# Patient Record
Sex: Female | Born: 1990 | Hispanic: No | Marital: Single | Smoking: Never smoker
Health system: Southern US, Community
[De-identification: ages and names within clinical notes are randomized; demographics above are authoritative.]

## PROBLEM LIST (undated history)

## (undated) DIAGNOSIS — H9190 Unspecified hearing loss, unspecified ear: Secondary | ICD-10-CM

## (undated) DIAGNOSIS — J45909 Unspecified asthma, uncomplicated: Secondary | ICD-10-CM

---

## 2017-02-14 ENCOUNTER — Emergency Department (HOSPITAL_COMMUNITY): Payer: Medicaid - Out of State

## 2017-02-14 ENCOUNTER — Encounter (HOSPITAL_COMMUNITY): Payer: Self-pay | Admitting: *Deleted

## 2017-02-14 ENCOUNTER — Emergency Department (HOSPITAL_COMMUNITY)
Admission: EM | Admit: 2017-02-14 | Discharge: 2017-02-14 | Disposition: A | Discharge status: Home / Self Care | Payer: Medicaid - Out of State | Attending: Emergency Medicine | Admitting: Emergency Medicine

## 2017-02-14 DIAGNOSIS — R062 Wheezing: Secondary | ICD-10-CM | POA: Diagnosis not present

## 2017-02-14 DIAGNOSIS — R0602 Shortness of breath: Secondary | ICD-10-CM | POA: Diagnosis not present

## 2017-02-14 DIAGNOSIS — R05 Cough: Secondary | ICD-10-CM

## 2017-02-14 DIAGNOSIS — R059 Cough, unspecified: Secondary | ICD-10-CM

## 2017-02-14 DIAGNOSIS — Z79899 Other long term (current) drug therapy: Secondary | ICD-10-CM | POA: Insufficient documentation

## 2017-02-14 DIAGNOSIS — Z880 Allergy status to penicillin: Secondary | ICD-10-CM | POA: Diagnosis not present

## 2017-02-14 LAB — CBC WITH DIFFERENTIAL/PLATELET
Basophils Absolute: 0.1 10*3/uL (ref 0.0–0.1)
Basophils Relative: 1 %
Eosinophils Absolute: 1.8 10*3/uL — ABNORMAL HIGH (ref 0.0–0.7)
Eosinophils Relative: 16 %
HEMATOCRIT: 43.3 % (ref 36.0–46.0)
HEMOGLOBIN: 15.4 g/dL — AB (ref 12.0–15.0)
LYMPHS ABS: 2.4 10*3/uL (ref 0.7–4.0)
LYMPHS PCT: 21 %
MCH: 29.6 pg (ref 26.0–34.0)
MCHC: 35.6 g/dL (ref 30.0–36.0)
MCV: 83.1 fL (ref 78.0–100.0)
Monocytes Absolute: 0.4 10*3/uL (ref 0.1–1.0)
Monocytes Relative: 4 %
NEUTROS PCT: 58 %
Neutro Abs: 6.7 10*3/uL (ref 1.7–7.7)
Platelets: 274 10*3/uL (ref 150–400)
RBC: 5.21 MIL/uL — AB (ref 3.87–5.11)
RDW: 12 % (ref 11.5–15.5)
WBC: 11.3 10*3/uL — ABNORMAL HIGH (ref 4.0–10.5)

## 2017-02-14 LAB — I-STAT TROPONIN, ED: Troponin i, poc: 0 ng/mL (ref 0.00–0.08)

## 2017-02-14 LAB — BASIC METABOLIC PANEL
Anion gap: 11 (ref 5–15)
BUN: 7 mg/dL (ref 6–20)
CHLORIDE: 104 mmol/L (ref 101–111)
CO2: 24 mmol/L (ref 22–32)
CREATININE: 0.68 mg/dL (ref 0.44–1.00)
Calcium: 9.5 mg/dL (ref 8.9–10.3)
GFR calc Af Amer: >60 mL/min (ref >60)
GFR calc non Af Amer: >60 mL/min (ref >60)
GLUCOSE: 94 mg/dL (ref 65–99)
POTASSIUM: 3.4 mmol/L — AB (ref 3.5–5.1)
Sodium: 139 mmol/L (ref 135–145)

## 2017-02-14 LAB — I-STAT BETA HCG BLOOD, ED (MC, WL, AP ONLY): I-stat hCG, quantitative: <5 m[IU]/mL (ref <5)

## 2017-02-14 MED ORDER — ALBUTEROL SULFATE (2.5 MG/3ML) 0.083% IN NEBU
5.0000 mg | INHALATION_SOLUTION | Freq: Once | RESPIRATORY_TRACT | Status: AC
  Administered 2017-02-14: 5 mg via RESPIRATORY_TRACT
  Filled 2017-02-14: qty 6

## 2017-02-14 MED ORDER — AEROCHAMBER PLUS FLO-VU LARGE MISC
Status: AC
  Filled 2017-02-14: qty 1

## 2017-02-14 MED ORDER — IOPAMIDOL (ISOVUE-370) INJECTION 76%
INTRAVENOUS | Status: AC
  Administered 2017-02-14: 100 mL via INTRAVENOUS
  Filled 2017-02-14: qty 100

## 2017-02-14 MED ORDER — AEROCHAMBER PLUS FLO-VU LARGE MISC
1.0000 | Freq: Once | Status: AC
  Administered 2017-02-14: 1
  Filled 2017-02-14: qty 1

## 2017-02-14 MED ORDER — ALBUTEROL SULFATE HFA 108 (90 BASE) MCG/ACT IN AERS: 1.0000 | INHALATION_SPRAY | Freq: Four times a day (QID) | RESPIRATORY_TRACT | 0 refills | Status: AC | PRN

## 2017-02-14 MED ORDER — PREDNISONE 20 MG PO TABS: 40.0000 mg | ORAL_TABLET | Freq: Every day | ORAL | 0 refills | Status: DC

## 2017-02-14 MED ORDER — IPRATROPIUM BROMIDE 0.02 % IN SOLN
0.5000 mg | Freq: Once | RESPIRATORY_TRACT | Status: AC
Start: 2017-02-14 — End: 2017-02-14
  Administered 2017-02-14: 0.5 mg via RESPIRATORY_TRACT
  Filled 2017-02-14: qty 2.5

## 2017-02-14 MED ORDER — ALBUTEROL SULFATE HFA 108 (90 BASE) MCG/ACT IN AERS
2.0000 | INHALATION_SPRAY | Freq: Once | RESPIRATORY_TRACT | Status: AC
  Administered 2017-02-14: 2 via RESPIRATORY_TRACT
  Filled 2017-02-14: qty 6.7

## 2017-02-14 MED ORDER — IPRATROPIUM BROMIDE 0.02 % IN SOLN
0.5000 mg | Freq: Once | RESPIRATORY_TRACT | Status: AC
  Administered 2017-02-14: 0.5 mg via RESPIRATORY_TRACT
  Filled 2017-02-14: qty 2.5

## 2017-02-14 NOTE — ED Notes (Signed)
ED Provider at bedside. 

## 2017-02-14 NOTE — ED Triage Notes (Signed)
The pt does not have asthma  She has had a cough for 2-3 weeks no temp    She feelslike something is stuck in her throat.  Not sure about anxiety.  lmp  marfch she just had a baby and she is breast feeding still

## 2017-02-14 NOTE — ED Notes (Signed)
Patient transported to CT 

## 2017-02-14 NOTE — ED Triage Notes (Addendum)
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

## 2017-02-14 NOTE — ED Notes (Signed)
Patient transported to X-ray 

## 2017-02-14 NOTE — ED Provider Notes (Signed)
[facility]-EMERGENCY DEPT Provider Note   CSN: [id number] Arrival date & time: 02/14/17  1611  By signing my name below, I, Linna Darner, attest that this documentation has been prepared under the direction and in the presence of Will Ralene Gasparyan, PA-C. Electronically Signed: Linna Darner, Scribe. 02/14/2017. 6:44 PM.  History   Chief Complaint Chief Complaint  Patient presents with  . Cough   The history is provided by the patient. A language interpreter was used (sign language).    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 Patient's cough has improved at times, but it has never completely resolved. A few days ago she experienced a sudden onset of dyspnea that has persisted and progressively worsened. She states that it feels like she cannot catch her breath. Patient has also had some occasional palpitations in association with the dyspnea; her last episode of palpitations was upon arrival to the ED and she denies any palpitations at interview. She received a breathing treatment in triage with some improvement of the dyspnea. Patient has no h/o asthma. She gave birth on 09/06/16 and recently stopped breast feeding the child. Patient does not use birth control or any daily medications. No recent surgeries or bodily trauma. Her grandmother had a blood clot in her heart and her mother had a DVT, but she has no personal history of blood clots. Patient denies hemoptysis, fevers, chills, nausea, vomiting, abdominal pain, chest pain, calf pain, leg swelling, or any other associated symptoms.  History reviewed. No pertinent past medical history.  There are no active problems to display for this patient.   History reviewed. No pertinent surgical history.  OB History    No data available       Home Medications    Prior to Admission medications   Medication Sig Start Date End Date  Taking? Authorizing Provider  albuterol (PROVENTIL HFA;VENTOLIN HFA) 108 (90 Base) MCG/ACT inhaler Inhale 1-2 puffs into the lungs every 6 (six) hours as needed for wheezing or shortness of breath. 02/14/17   Everlene Farrier, PA-C  predniSONE (DELTASONE) 20 MG tablet Take 2 tablets (40 mg total) by mouth daily. 02/14/17   Everlene Farrier, PA-C    Family History No family history on file.  Social History Social History  Substance Use Topics  . Smoking status: Never Smoker  . Smokeless tobacco: Never Used  . Alcohol use No     Allergies   Amoxicillin   Review of Systems Review of Systems  Constitutional: Negative for chills and fever.  HENT: Negative for congestion and sore throat.   Eyes: Negative for visual disturbance.  Respiratory: Positive for cough, shortness of breath and wheezing.   Cardiovascular: Positive for palpitations. Negative for chest pain and leg swelling.  Gastrointestinal: Negative for abdominal pain, diarrhea, nausea and vomiting.  Genitourinary: Negative for dysuria.  Musculoskeletal: Negative for back pain, myalgias and neck pain.  Skin: Negative for rash.  Neurological: Negative for syncope, light-headedness and headaches.   Physical Exam Updated Vital Signs BP 105/70   Pulse 92   Temp 98.6 F (37 C) (Oral)   Resp 13   Ht 5\' 5"  (1.651 m)   Wt 67.1 kg (148 lb)   SpO2 96%   BMI 24.63 kg/m   Physical Exam  Constitutional: She appears well-developed and well-nourished. No distress.  Nontoxic-appearing.  HENT:  Head: Normocephalic and atraumatic.  Right Ear: External ear normal.  Left Ear: External ear normal.  Mouth/Throat: Oropharynx is clear and moist.  Eyes: Pupils are equal, round, and reactive to light. Conjunctivae are normal. Right eye exhibits no discharge. Left eye exhibits no discharge.  Neck: Normal range of motion. Neck supple. No JVD present. No tracheal deviation present.  Cardiovascular: Normal rate, regular rhythm, normal heart  sounds and intact distal pulses.  Exam reveals no gallop and no friction rub.   No murmur heard. Bilateral radial, posterior tibialis and dorsalis pedis pulses are intact.    Pulmonary/Chest: Effort normal. No stridor. No respiratory distress. She has wheezes. She has no rales. She exhibits no tenderness.  Expiratory wheezes noted bilaterally. No rales or rhonchi. No increased work of breathing.  Abdominal: Soft. There is no tenderness. There is no guarding.  Musculoskeletal: Normal range of motion. She exhibits no edema or tenderness.  No lower extremity edema or tenderness.  Lymphadenopathy:    She has no cervical adenopathy.  Neurological: She is alert. Coordination normal.  Skin: Skin is warm and dry. Capillary refill takes less than 2 seconds. No rash noted. She is not diaphoretic. No erythema. No pallor.  Psychiatric: She has a normal mood and affect. Her behavior is normal.  Nursing note and vitals reviewed.  ED Treatments / Results  Labs (all labs ordered are listed, but only abnormal results are displayed) Labs Reviewed  BASIC METABOLIC PANEL - Abnormal; Notable for the following:       Result Value   Potassium 3.4 (*)    All other components within normal limits  CBC WITH DIFFERENTIAL/PLATELET - Abnormal; Notable for the following:    WBC 11.3 (*)    RBC 5.21 (*)    Hemoglobin 15.4 (*)    Eosinophils Absolute 1.8 (*)    All other components within normal limits  I-STAT TROPONIN, ED  I-STAT BETA HCG BLOOD, ED ([facility], [facility], [facility] ONLY)    EKG  EKG Interpretation ED ECG REPORT   Date: 02/14/2017  Rate: 78  Rhythm: normal sinus rhythm  QRS Axis: right  Intervals: QT prolonged (borderline)   ST/T Wave abnormalities: nonspecific T wave changes  Conduction Disutrbances:none  Narrative Interpretation:   Old EKG Reviewed: none available  I have personally reviewed the EKG tracing and agree with the computerized printout as noted.        Radiology Dg Chest 2  View  Result Date: 02/14/2017 CLINICAL DATA:  Shortness of breath.  Recent pregnancy. EXAM: CHEST  2 VIEW COMPARISON:  None. FINDINGS: The heart size and mediastinal contours are within normal limits. Both lungs are clear. The visualized skeletal structures are unremarkable. IMPRESSION: No active cardiopulmonary disease. Electronically Signed   By: Gerome Sam III M.D   On: 02/14/2017 17:56   Ct Angio Chest Pe W/cm &/or Wo Cm  Result Date: 02/14/2017 CLINICAL DATA:  Chronic cough and shortness of breath. Palpitations. Initial encounter. EXAM: CT ANGIOGRAPHY CHEST WITH CONTRAST TECHNIQUE: Multidetector CT imaging of the chest was performed using the standard protocol during bolus administration of intravenous contrast. Multiplanar CT image reconstructions and MIPs were obtained to evaluate the vascular anatomy. CONTRAST:  100 mL of Isovue 300 IV contrast COMPARISON:  Chest radiograph performed earlier today at 5:44 p.m. FINDINGS: Cardiovascular:  There is no evidence of pulmonary embolus. The heart remains normal in size. The thoracic aorta is unremarkable in appearance. The great vessels are within normal limits. Mediastinum/Nodes: Vague soft tissue density about the carina and subcarinal region is thought to reflect small nodes and  trace fluid, within normal limits. No significant pericardial effusion is identified. The visualized portions of the thyroid gland are unremarkable. No axillary lymphadenopathy is seen. Lungs/Pleura: The lungs are clear bilaterally. No focal consolidation, pleural effusion or pneumothorax is seen. No masses are identified. Upper Abdomen: The visualized portions of the liver and spleen are unremarkable. Musculoskeletal: No acute osseous abnormalities are identified. The visualized musculature is unremarkable in appearance. Review of the MIP images confirms the above findings. IMPRESSION: 1. No evidence of pulmonary embolus. 2. Lungs clear bilaterally. Electronically Signed   By:  Roanna RaiderJeffery  Chang M.D.   On: 02/14/2017 21:16    Procedures Procedures (including critical care time)  DIAGNOSTIC STUDIES: Oxygen Saturation is 95% on RA, adequate by my interpretation.    COORDINATION OF CARE: 6:43 PM Discussed treatment plan with pt at bedside and pt agreed to plan.  Medications Ordered in ED Medications  albuterol (PROVENTIL HFA;VENTOLIN HFA) 108 (90 Base) MCG/ACT inhaler 2 puff (not administered)  AEROCHAMBER PLUS FLO-VU LARGE MISC 1 each (not administered)  albuterol (PROVENTIL) (2.5 MG/3ML) 0.083% nebulizer solution 5 mg (5 mg Nebulization Given 02/14/17 1624)  ipratropium (ATROVENT) nebulizer solution 0.5 mg (0.5 mg Nebulization Given 02/14/17 1624)  albuterol (PROVENTIL) (2.5 MG/3ML) 0.083% nebulizer solution 5 mg (5 mg Nebulization Given 02/14/17 1921)  ipratropium (ATROVENT) nebulizer solution 0.5 mg (0.5 mg Nebulization Given 02/14/17 1922)  iopamidol (ISOVUE-370) 76 % injection (100 mLs Intravenous Contrast Given 02/14/17 2009)     Initial Impression / Assessment and Plan / ED Course  I have reviewed the triage vital signs and the nursing notes.  Pertinent labs & imaging results that were available during my care of the patient were reviewed by me and considered in my medical decision making (see chart for details).    This is a deaf 26 y.o. female who presents to the Emergency Department complaining of a persistent cough ongoing for about one month. Patient's cough has improved at times, but it has never completely resolved. A few days ago she experienced a sudden onset of dyspnea that has persisted and progressively worsened. She states that it feels like she cannot catch her breath. Patient has also had some occasional palpitations in association with the dyspnea; her last episode of palpitations was upon arrival to the ED and she denies any palpitations at interview. She received a breathing treatment in triage with some improvement of the dyspnea. Patient has  no h/o asthma. She gave birth on 09/06/16 and recently stopped breast feeding the child. Patient does not use birth control or any daily medications. On arrival to the emergency room in triage the patient had some mild tachycardia with a heart rate of 105 and oxygen saturations 95% on room air. On my exam patient is afebrile nontoxic appearing. She has no tachypnea, tachycardia or hypoxia on exam. She does have wheezing noted diffusely. Chest x-ray performed in triage is unremarkable. Due to this patient's history and risk factors will obtain a CT angiography of her chest rule out PE. Will also obtain blood work and EKG. Patient agrees with plan. EKG is without sign of STEMI. Troponin is not elevated. Perhaps he does is negative. CBC and BMP are unremarkable. CT angiogram of her chest PE study shows no evidence of PE. It is unremarkable. At reevaluation following patient's second breathing treatment she reports her breathing is much better. She no longer feels short of breath. Suspect this is related to wheezing versus upper respiratory infection. Will start patient on albuterol inhaler and  short course of prednisone. Work appears reassuring. Strict and specific return precautions discussed. I advised the patient to follow-up with their primary care provider this week. I advised the patient to return to the emergency department with new or worsening symptoms or new concerns. The patient verbalized understanding and agreement with plan.      Final Clinical Impressions(s) / ED Diagnoses   Final diagnoses:  Cough  SOB (shortness of breath)  Wheezing    New Prescriptions New Prescriptions   ALBUTEROL (PROVENTIL HFA;VENTOLIN HFA) 108 (90 BASE) MCG/ACT INHALER    Inhale 1-2 puffs into the lungs every 6 (six) hours as needed for wheezing or shortness of breath.   PREDNISONE (DELTASONE) 20 MG TABLET    Take 2 tablets (40 mg total) by mouth daily.   I personally performed the services described in this  documentation, which was scribed in my presence. The recorded information has been reviewed and is accurate.      Everlene Farrier, PA-C 02/14/17 2247    Jacalyn Lefevre, MD 02/17/17 [phone]

## 2017-03-22 ENCOUNTER — Emergency Department (HOSPITAL_COMMUNITY)
Admission: EM | Admit: 2017-03-22 | Discharge: 2017-03-22 | Disposition: A | Discharge status: Home / Self Care | Payer: Medicaid - Out of State | Attending: Emergency Medicine | Admitting: Emergency Medicine

## 2017-03-22 ENCOUNTER — Encounter (HOSPITAL_COMMUNITY): Payer: Self-pay | Admitting: *Deleted

## 2017-03-22 ENCOUNTER — Emergency Department (HOSPITAL_COMMUNITY): Payer: Medicaid - Out of State

## 2017-03-22 DIAGNOSIS — J45901 Unspecified asthma with (acute) exacerbation: Secondary | ICD-10-CM | POA: Diagnosis not present

## 2017-03-22 DIAGNOSIS — R0602 Shortness of breath: Secondary | ICD-10-CM

## 2017-03-22 DIAGNOSIS — R062 Wheezing: Secondary | ICD-10-CM

## 2017-03-22 HISTORY — DX: Unspecified asthma, uncomplicated: J45.909

## 2017-03-22 MED ORDER — ALBUTEROL SULFATE (2.5 MG/3ML) 0.083% IN NEBU
5.0000 mg | INHALATION_SOLUTION | Freq: Once | RESPIRATORY_TRACT | Status: AC
  Administered 2017-03-22: 5 mg via RESPIRATORY_TRACT

## 2017-03-22 MED ORDER — ALBUTEROL SULFATE (2.5 MG/3ML) 0.083% IN NEBU
INHALATION_SOLUTION | RESPIRATORY_TRACT | Status: AC
  Administered 2017-03-22: 5 mg via RESPIRATORY_TRACT
  Filled 2017-03-22: qty 6

## 2017-03-22 MED ORDER — PREDNISONE 10 MG PO TABS: 40.0000 mg | ORAL_TABLET | Freq: Every day | ORAL | 0 refills | Status: AC

## 2017-03-22 MED ORDER — IPRATROPIUM-ALBUTEROL 0.5-2.5 (3) MG/3ML IN SOLN
3.0000 mL | Freq: Once | RESPIRATORY_TRACT | Status: AC
Start: 2017-03-22 — End: 2017-03-22
  Administered 2017-03-22: 3 mL via RESPIRATORY_TRACT
  Filled 2017-03-22: qty 3

## 2017-03-22 MED ORDER — ALBUTEROL SULFATE HFA 108 (90 BASE) MCG/ACT IN AERS
1.0000 | INHALATION_SPRAY | Freq: Once | RESPIRATORY_TRACT | Status: AC
  Administered 2017-03-22: 1 via RESPIRATORY_TRACT
  Filled 2017-03-22: qty 6.7

## 2017-03-22 MED ORDER — METHYLPREDNISOLONE SODIUM SUCC 125 MG IJ SOLR
125.0000 mg | Freq: Once | INTRAMUSCULAR | Status: AC
  Administered 2017-03-22: 125 mg via INTRAVENOUS
  Filled 2017-03-22: qty 2

## 2017-03-22 MED ORDER — BENZONATATE 100 MG PO CAPS: 100.0000 mg | ORAL_CAPSULE | Freq: Three times a day (TID) | ORAL | 0 refills | Status: AC

## 2017-03-22 NOTE — ED Notes (Signed)
Patient was transported to x ray from lobby.  Will return to room upon completion of x ray.

## 2017-03-22 NOTE — Discharge Instructions (Signed)
Please read attached information regarding your condition. Use albuterol as needed for wheezing and shortness of breath. Take prednisone 40 mg for 5 days as directed. Follow-up at [facility] wellness for further evaluation. Return to ED for chest pain, increased shortness of breath, wheezing, coughing up blood, leg swelling, fevers.

## 2017-03-22 NOTE — ED Triage Notes (Addendum)
To ED for eval of sob. States thru [employer] interp [phone]) that she was on meds which helped but ran out. Wheezing noted.

## 2017-03-22 NOTE — ED Provider Notes (Signed)
[facility]-EMERG[facility]Y DEPT Provider Note   CSN: [id number] Arrival date & time: 03/22/17  3295     History   Chief Complaint Chief Complaint  Patient presents with  . Shortness of Breath    HPI Sherry Singh is a 26 y.o. female.  HPI  Patient presents to ED for evaluation of cough and shortness of breath for the past week. She did have symptoms similar in the past last month which resolved with the medications that were given to her. She reports productive cough with white mucus. She has uses an inhaler with relief in her symptoms but has since ran out. She states that she was never diagnosed with asthma in the past. She denies any chest pain, fever, recent surgery, OCP use, leg swelling, recent prolonged travel, previous DVT, PE.  Past Medical History:  Diagnosis Date  . Asthma     There are no active problems to display for this patient.   History reviewed. No pertinent surgical history.  OB History    No data available       Home Medications    Prior to Admission medications   Medication Sig Start Date End Date Taking? Authorizing Provider  albuterol (PROVENTIL HFA;VENTOLIN HFA) 108 (90 Base) MCG/ACT inhaler Inhale 1-2 puffs into the lungs every 6 (six) hours as needed for wheezing or shortness of breath. 02/14/17   Everlene Farrier, PA-C  benzonatate (TESSALON) 100 MG capsule Take 1 capsule (100 mg total) by mouth every 8 (eight) hours. 03/22/17   Albie Arizpe, PA-C  predniSONE (DELTASONE) 10 MG tablet Take 4 tablets (40 mg total) by mouth daily. 03/22/17 03/27/17  Dietrich Pates, PA-C    Family History No family history on file.  Social History Social History  Substance Use Topics  . Smoking status: Never Smoker  . Smokeless tobacco: Never Used  . Alcohol use No     Allergies   Amoxicillin   Review of Systems Review of Systems  Constitutional: Negative for chills and fever.  HENT: Positive for hearing loss (Deaf), rhinorrhea and sneezing. Negative for  congestion, dental problem, ear discharge, ear pain, sore throat and trouble swallowing.   Respiratory: Positive for cough and shortness of breath. Negative for chest tightness.   Cardiovascular: Negative for chest pain and palpitations.  Gastrointestinal: Negative for nausea and vomiting.  Skin: Negative for rash.     Physical Exam Updated Vital Signs BP 110/88 (BP Location: Right Arm)   Pulse (!) 106   Temp 98.4 F (36.9 C) (Oral)   Resp (!) 22   LMP 03/18/2017   SpO2 100%   Physical Exam  Constitutional: She appears well-developed and well-nourished. No distress.  Nontoxic appearing and in no acute distress.  HENT:  Head: Normocephalic and atraumatic.  Eyes: Conjunctivae and EOM are normal. No scleral icterus.  Neck: Normal range of motion.  Cardiovascular: Normal rate, regular rhythm and normal heart sounds.   Pulmonary/Chest: Effort normal. No respiratory distress. She has wheezes.  End expiratory wheezing noted throughout bilateral lung fields. Chest rise and fall symmetrical.  Neurological: She is alert.  Skin: No rash noted. She is not diaphoretic.  Psychiatric: She has a normal mood and affect.  Nursing note and vitals reviewed.    ED Treatments / Results  Labs (all labs ordered are listed, but only abnormal results are displayed) Labs Reviewed - No data to display  EKG  EKG Interpretation None       Radiology Dg Chest 2 View  Result Date: 03/22/2017 CLINICAL  DATA:  Midline chest pain EXAM: CHEST  2 VIEW COMPARISON:  02/14/2017 FINDINGS: Heart and mediastinal contours are within normal limits. No focal opacities or effusions. No acute bony abnormality. IMPRESSION: No active cardiopulmonary disease. Electronically Signed   By: Charlett Nose M.D.   On: 03/22/2017 09:59    Procedures Procedures (including critical care time)  Medications Ordered in ED Medications  albuterol (PROVENTIL) (2.5 MG/3ML) 0.083% nebulizer solution 5 mg (5 mg Nebulization Given  03/22/17 0915)  ipratropium-albuterol (DUONEB) 0.5-2.5 (3) MG/3ML nebulizer solution 3 mL (3 mLs Nebulization Given 03/22/17 1114)  methylPREDNISolone sodium succinate (SOLU-MEDROL) 125 mg/2 mL injection 125 mg (125 mg Intravenous Given 03/22/17 1115)  albuterol (PROVENTIL HFA;VENTOLIN HFA) 108 (90 Base) MCG/ACT inhaler 1 puff (1 puff Inhalation Given 03/22/17 1114)     Initial Impression / Assessment and Plan / ED Course  I have reviewed the triage vital signs and the nursing notes.  Pertinent labs & imaging results that were available during my care of the patient were reviewed by me and considered in my medical decision making (see chart for details).     Patient presents to ED for evaluation of wheezing and shortness of breath for the past week. She reports similar symptoms approximately one month ago which resolved with the medications that were given to her. She states that she has since ran out of the medications. She has not tried any other medications to help. She denies any chest pain, hemoptysis, leg swelling, OCP use, blood thinner use, recent prolonged travel. On initial examination she is tachycardic but oxygen saturations 100% on room air. She is afebrile with no history of fever. She is nontoxic appearing. She has diffuse and expiratory wheezing noted throughout bilateral lung fields. Chest x-ray returned as negative. When patient was here one month ago lab work and CT angiography were completed which were negative for any acute findings. I do not think a repeat CTA would be necessary at this time based on similar symptoms that have improved with albuterol and steroids. She has no risk factors for PE. Patient given 2 breathing treatments here which much relief in her symptoms. Also given Solu-Medrol. I suspect that this could be an asthma-like illness although she has no history of this in childhood or in the past. We'll discharge with steroids and Tessalon to be taken as needed. Patient  given an inhaler here in the ED today. I encouraged her to follow up at [facility] and wellness for further evaluation and to be put on any daily medications to help with symptoms.Patient appears stable for discharge at this time. Strict return precautions given.   Final Clinical Impressions(s) / ED Diagnoses   Final diagnoses:  Exacerbation of asthma, unspecified asthma severity, unspecified whether persistent    New Prescriptions New Prescriptions   BENZONATATE (TESSALON) 100 MG CAPSULE    Take 1 capsule (100 mg total) by mouth every 8 (eight) hours.   PREDNISONE (DELTASONE) 10 MG TABLET    Take 4 tablets (40 mg total) by mouth daily.     Dietrich Pates, PA-C 03/22/17 1206    Linwood Dibbles, MD 03/26/17 0830

## 2017-04-01 ENCOUNTER — Inpatient Hospital Stay: Payer: Medicaid - Out of State

## 2017-04-05 ENCOUNTER — Inpatient Hospital Stay: Payer: Medicaid - Out of State

## 2017-04-12 ENCOUNTER — Other Ambulatory Visit: Payer: Self-pay

## 2017-04-12 ENCOUNTER — Encounter (HOSPITAL_COMMUNITY): Payer: Self-pay

## 2017-04-12 ENCOUNTER — Emergency Department (HOSPITAL_BASED_OUTPATIENT_CLINIC_OR_DEPARTMENT_OTHER)
Admit: 2017-04-12 | Discharge: 2017-04-12 | Disposition: A | Discharge status: Home / Self Care | Payer: Medicaid - Out of State

## 2017-04-12 ENCOUNTER — Observation Stay (HOSPITAL_COMMUNITY)
Admission: EM | Admit: 2017-04-12 | Discharge: 2017-04-13 | Disposition: A | Discharge status: Home / Self Care | Payer: Medicaid - Out of State | Attending: Internal Medicine | Admitting: Internal Medicine

## 2017-04-12 ENCOUNTER — Emergency Department (HOSPITAL_COMMUNITY): Payer: Medicaid - Out of State

## 2017-04-12 DIAGNOSIS — J45901 Unspecified asthma with (acute) exacerbation: Secondary | ICD-10-CM | POA: Diagnosis not present

## 2017-04-12 DIAGNOSIS — M79609 Pain in unspecified limb: Secondary | ICD-10-CM

## 2017-04-12 DIAGNOSIS — M79604 Pain in right leg: Secondary | ICD-10-CM

## 2017-04-12 DIAGNOSIS — R000 Tachycardia, unspecified: Secondary | ICD-10-CM | POA: Insufficient documentation

## 2017-04-12 DIAGNOSIS — R109 Unspecified abdominal pain: Secondary | ICD-10-CM | POA: Diagnosis present

## 2017-04-12 DIAGNOSIS — Z832 Family history of diseases of the blood and blood-forming organs and certain disorders involving the immune mechanism: Secondary | ICD-10-CM | POA: Diagnosis not present

## 2017-04-12 DIAGNOSIS — Z79899 Other long term (current) drug therapy: Secondary | ICD-10-CM | POA: Diagnosis not present

## 2017-04-12 DIAGNOSIS — Z880 Allergy status to penicillin: Secondary | ICD-10-CM | POA: Diagnosis not present

## 2017-04-12 DIAGNOSIS — R1012 Left upper quadrant pain: Secondary | ICD-10-CM

## 2017-04-12 DIAGNOSIS — E876 Hypokalemia: Secondary | ICD-10-CM | POA: Diagnosis present

## 2017-04-12 DIAGNOSIS — H9190 Unspecified hearing loss, unspecified ear: Secondary | ICD-10-CM | POA: Insufficient documentation

## 2017-04-12 DIAGNOSIS — M7989 Other specified soft tissue disorders: Secondary | ICD-10-CM

## 2017-04-12 DIAGNOSIS — J4541 Moderate persistent asthma with (acute) exacerbation: Secondary | ICD-10-CM | POA: Diagnosis present

## 2017-04-12 DIAGNOSIS — N390 Urinary tract infection, site not specified: Secondary | ICD-10-CM | POA: Diagnosis not present

## 2017-04-12 HISTORY — DX: Unspecified hearing loss, unspecified ear: H91.90

## 2017-04-12 LAB — COMPREHENSIVE METABOLIC PANEL
ALBUMIN: 4 g/dL (ref 3.5–5.0)
ALT: 48 U/L (ref 14–54)
AST: 39 U/L (ref 15–41)
Alkaline Phosphatase: 68 U/L (ref 38–126)
Anion gap: 10 (ref 5–15)
BUN: 6 mg/dL (ref 6–20)
CHLORIDE: 102 mmol/L (ref 101–111)
CO2: 24 mmol/L (ref 22–32)
Calcium: 9.1 mg/dL (ref 8.9–10.3)
Creatinine, Ser: 0.58 mg/dL (ref 0.44–1.00)
GFR calc Af Amer: >60 mL/min (ref >60)
GFR calc non Af Amer: >60 mL/min (ref >60)
Glucose, Bld: 108 mg/dL — ABNORMAL HIGH (ref 65–99)
POTASSIUM: 3.1 mmol/L — AB (ref 3.5–5.1)
SODIUM: 136 mmol/L (ref 135–145)
TOTAL PROTEIN: 7.2 g/dL (ref 6.5–8.1)
Total Bilirubin: 0.9 mg/dL (ref 0.3–1.2)

## 2017-04-12 LAB — URINALYSIS, ROUTINE W REFLEX MICROSCOPIC
BILIRUBIN URINE: NEGATIVE
Glucose, UA: NEGATIVE mg/dL
KETONES UR: 20 mg/dL — AB
NITRITE: NEGATIVE
PH: 6 (ref 5.0–8.0)
Protein, ur: 30 mg/dL — AB
SPECIFIC GRAVITY, URINE: 1.019 (ref 1.005–1.030)

## 2017-04-12 LAB — CBC
HEMATOCRIT: 41.8 % (ref 36.0–46.0)
Hemoglobin: 14.9 g/dL (ref 12.0–15.0)
MCH: 29.9 pg (ref 26.0–34.0)
MCHC: 35.6 g/dL (ref 30.0–36.0)
MCV: 83.8 fL (ref 78.0–100.0)
PLATELETS: 290 10*3/uL (ref 150–400)
RBC: 4.99 MIL/uL (ref 3.87–5.11)
RDW: 12.2 % (ref 11.5–15.5)
WBC: 10.5 10*3/uL (ref 4.0–10.5)

## 2017-04-12 LAB — LIPASE, BLOOD: Lipase: 21 U/L (ref 11–51)

## 2017-04-12 LAB — I-STAT VENOUS BLOOD GAS, ED
Acid-base deficit: 10 mmol/L — ABNORMAL HIGH (ref 0.0–2.0)
Bicarbonate: 14.2 mmol/L — ABNORMAL LOW (ref 20.0–28.0)
O2 Saturation: 84 %
TCO2: 15 mmol/L — ABNORMAL LOW (ref 22–32)
pCO2, Ven: 25 mmHg — ABNORMAL LOW (ref 44.0–60.0)
pH, Ven: 7.361 (ref 7.250–7.430)
pO2, Ven: 49 mmHg — ABNORMAL HIGH (ref 32.0–45.0)

## 2017-04-12 LAB — I-STAT BETA HCG BLOOD, ED (MC, WL, AP ONLY): I-stat hCG, quantitative: <5 m[IU]/mL (ref <5)

## 2017-04-12 MED ORDER — SODIUM CHLORIDE 0.9 % IV SOLN
INTRAVENOUS | Status: DC
  Administered 2017-04-12 – 2017-04-13 (×2): via INTRAVENOUS

## 2017-04-12 MED ORDER — ALBUTEROL SULFATE (2.5 MG/3ML) 0.083% IN NEBU
5.0000 mg | INHALATION_SOLUTION | Freq: Once | RESPIRATORY_TRACT | Status: AC
  Administered 2017-04-12: 5 mg via RESPIRATORY_TRACT
  Filled 2017-04-12: qty 6

## 2017-04-12 MED ORDER — ONDANSETRON HCL 4 MG PO TABS
4.0000 mg | ORAL_TABLET | Freq: Four times a day (QID) | ORAL | Status: DC | PRN
Start: 2017-04-12 — End: 2017-04-13

## 2017-04-12 MED ORDER — PREDNISONE 50 MG PO TABS
60.0000 mg | ORAL_TABLET | Freq: Every day | ORAL | Status: DC
  Administered 2017-04-13: 60 mg via ORAL
  Filled 2017-04-12: qty 1

## 2017-04-12 MED ORDER — FOSFOMYCIN TROMETHAMINE 3 G PO PACK
3.0000 g | PACK | Freq: Once | ORAL | Status: AC
  Administered 2017-04-12: 3 g via ORAL
  Filled 2017-04-12: qty 3

## 2017-04-12 MED ORDER — ALBUTEROL (5 MG/ML) CONTINUOUS INHALATION SOLN
10.0000 mg/h | INHALATION_SOLUTION | RESPIRATORY_TRACT | Status: DC
  Administered 2017-04-12: 10 mg/h via RESPIRATORY_TRACT
  Filled 2017-04-12: qty 20

## 2017-04-12 MED ORDER — IOPAMIDOL (ISOVUE-300) INJECTION 61%
INTRAVENOUS | Status: AC
  Filled 2017-04-12: qty 100

## 2017-04-12 MED ORDER — IPRATROPIUM BROMIDE 0.02 % IN SOLN
0.5000 mg | Freq: Once | RESPIRATORY_TRACT | Status: AC
  Administered 2017-04-12: 0.5 mg via RESPIRATORY_TRACT
  Filled 2017-04-12: qty 2.5

## 2017-04-12 MED ORDER — METHYLPREDNISOLONE SODIUM SUCC 125 MG IJ SOLR
125.0000 mg | Freq: Once | INTRAMUSCULAR | Status: AC
  Administered 2017-04-12: 125 mg via INTRAVENOUS
  Filled 2017-04-12: qty 2

## 2017-04-12 MED ORDER — ENOXAPARIN SODIUM 40 MG/0.4ML ~~LOC~~ SOLN
40.0000 mg | SUBCUTANEOUS | Status: DC
  Filled 2017-04-12: qty 0.4

## 2017-04-12 MED ORDER — MAGNESIUM SULFATE 2 GM/50ML IV SOLN
2.0000 g | Freq: Once | INTRAVENOUS | Status: AC
  Administered 2017-04-12: 2 g via INTRAVENOUS
  Filled 2017-04-12: qty 50

## 2017-04-12 MED ORDER — IPRATROPIUM-ALBUTEROL 0.5-2.5 (3) MG/3ML IN SOLN
3.0000 mL | Freq: Four times a day (QID) | RESPIRATORY_TRACT | Status: DC
  Administered 2017-04-13 (×2): 3 mL via RESPIRATORY_TRACT
  Filled 2017-04-12: qty 3

## 2017-04-12 MED ORDER — SODIUM CHLORIDE 0.9 % IV BOLUS (SEPSIS)
1000.0000 mL | Freq: Once | INTRAVENOUS | Status: AC
  Administered 2017-04-12: 1000 mL via INTRAVENOUS

## 2017-04-12 MED ORDER — ACETAMINOPHEN 325 MG PO TABS
650.0000 mg | ORAL_TABLET | Freq: Four times a day (QID) | ORAL | Status: DC | PRN
  Administered 2017-04-13: 650 mg via ORAL
  Filled 2017-04-12: qty 2

## 2017-04-12 MED ORDER — IPRATROPIUM-ALBUTEROL 0.5-2.5 (3) MG/3ML IN SOLN: 3.0000 mL | RESPIRATORY_TRACT | Status: DC | PRN

## 2017-04-12 MED ORDER — ALBUTEROL (5 MG/ML) CONTINUOUS INHALATION SOLN
15.0000 mg/h | INHALATION_SOLUTION | RESPIRATORY_TRACT | Status: DC
  Administered 2017-04-12: 15 mg/h via RESPIRATORY_TRACT
  Filled 2017-04-12: qty 20

## 2017-04-12 MED ORDER — ONDANSETRON HCL 4 MG/2ML IJ SOLN: 4.0000 mg | Freq: Four times a day (QID) | INTRAMUSCULAR | Status: DC | PRN

## 2017-04-12 MED ORDER — POTASSIUM CHLORIDE CRYS ER 20 MEQ PO TBCR
40.0000 meq | EXTENDED_RELEASE_TABLET | ORAL | Status: AC
  Administered 2017-04-12: 40 meq via ORAL
  Filled 2017-04-12: qty 2

## 2017-04-12 MED ORDER — ACETAMINOPHEN 650 MG RE SUPP: 650.0000 mg | Freq: Four times a day (QID) | RECTAL | Status: DC | PRN

## 2017-04-12 MED ORDER — IOPAMIDOL (ISOVUE-300) INJECTION 61%
INTRAVENOUS | Status: AC
  Administered 2017-04-12: 100 mL
  Filled 2017-04-12: qty 100

## 2017-04-12 NOTE — Progress Notes (Signed)
CAT started again per MD orders. Remains on RA, with sats of 98%.  LS with scattered wheezes

## 2017-04-12 NOTE — Progress Notes (Signed)
CAT stopped. Pt is stable at this time.

## 2017-04-12 NOTE — ED Triage Notes (Signed)
Pt here for right calf pain. Denies long travel or any other dvt symptoms.Pt states pain has gotten progressively worse. Does have family hx of blood clot. Pt also reports abdominal pain as well. Triaged using [employer] interpreter [phone].

## 2017-04-12 NOTE — ED Notes (Signed)
Patient currently in CT °

## 2017-04-12 NOTE — ED Provider Notes (Signed)
[facility]-EMERGENCY DEPT Provider Note   CSN: [id number] Arrival date & time: 04/12/17  1019     History   Chief Complaint Chief Complaint  Patient presents with  . Leg Pain  . Abdominal Pain    HPI Sherry Singh is a 26 y.o. female the emergency department with multiple complaints. 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 PATIENT COMPLAINS OF right lower extremity pain that she has noticed for 1 week. She states that at first she thought she might of pulled a muscle. It is worse with ambulation and touch. She states it is behind the calf. She has noticed dilation off her veins which is new. She thinks there may be some swelling. States that at times it feels like it's going numb. She denies any gluteal or thigh pain. She denies any new urinary symptoms, incontinence, saddle anesthesia.   Patient also complains of abdominal pain. She states that for several weeks she has had postprandial nausea and has been having episodes of vomiting after eating. She states that when she was pregnant she was told she had a problem with her told to wait to see if it resolved until after she gave birth. She is currently 7 months postpartum. She states that 2 days ago she had 2 episodes of bilious vomiting with blood streaking. She states that she feels mildly nauseated all the time and has a decreased appetite. The patient states that currently her pain is located in her left lower quadrant. She is currently menstruating and her last menstrual period began on 10 5. She is not currently using birth control. She has a family hx of blood clots. She denies hemoptysis.  Lastly patient states that one week ago she developed an upper respiratory infection. She has had a productive cough and she has noticed sig significantly worsening tightness and wheezing over the past week.  HPI  Past Medical History:  Diagnosis Date  . Asthma   . Deaf     There are no active problems to display for this  patient.   History reviewed. No pertinent surgical history.  OB History    No data available       Home Medications    Prior to Admission medications   Medication Sig Start Date End Date Taking? Authorizing Provider  albuterol (PROVENTIL HFA;VENTOLIN HFA) 108 (90 Base) MCG/ACT inhaler Inhale 1-2 puffs into the lungs every 6 (six) hours as needed for wheezing or shortness of breath. 02/14/17   Everlene Farrier, PA-C  benzonatate (TESSALON) 100 MG capsule Take 1 capsule (100 mg total) by mouth every 8 (eight) hours. 03/22/17   Dietrich Pates, PA-C    Family History History reviewed. No pertinent family history.  Social History Social History  Substance Use Topics  . Smoking status: Never Smoker  . Smokeless tobacco: Never Used  . Alcohol use No     Allergies   Amoxicillin   Review of Systems Review of Systems  Ten systems reviewed and are negative for acute change, except as noted in the HPI.   Physical Exam Updated Vital Signs BP 115/72   Pulse 94   Temp 98.7 F (37.1 C) (Oral)   Resp 12   LMP 04/12/2017 (Within Days)   SpO2 95%   Physical Exam  Constitutional: She is oriented to person, place, and time. She appears well-developed and well-nourished. No distress.  HENT:  Head: Normocephalic and atraumatic.  Eyes: Conjunctivae are normal. No scleral icterus.  Neck: Normal range of  motion.  Cardiovascular: Normal rate, regular rhythm and normal heart sounds.  Exam reveals no gallop and no friction rub.   No murmur heard. Right lower extremity is somewhat discolored from the left. There is some clenching ectasia on the medial aspect of the knee which is new and different from the left leg. She is tender in the popliteal fossa and in the right calf.  Pulmonary/Chest: Effort normal. No respiratory distress. She has wheezes.  Abdominal: Soft. Bowel sounds are normal. She exhibits no distension and no mass. There is no tenderness. There is no guarding.  Neurological:  She is alert and oriented to person, place, and time.  Skin: Skin is warm and dry. She is not diaphoretic.  Psychiatric: Her behavior is normal.  Nursing note and vitals reviewed.    ED Treatments / Results  Labs (all labs ordered are listed, but only abnormal results are displayed) Labs Reviewed  COMPREHENSIVE METABOLIC PANEL - Abnormal; Notable for the following:       Result Value   Potassium 3.1 (*)    Glucose, Bld 108 (*)    All other components within normal limits  URINALYSIS, ROUTINE W REFLEX MICROSCOPIC - Abnormal; Notable for the following:    Color, Urine AMBER (*)    APPearance HAZY (*)    Hgb urine dipstick LARGE (*)    Ketones, ur 20 (*)    Protein, ur 30 (*)    Leukocytes, UA SMALL (*)    Bacteria, UA RARE (*)    Squamous Epithelial / LPF 0-5 (*)    All other components within normal limits  LIPASE, BLOOD  CBC  I-STAT BETA HCG BLOOD, ED ([facility], [facility], [facility] ONLY)    EKG  EKG Interpretation None       Radiology No results found.  Procedures Procedures (including critical care time)  Medications Ordered in ED Medications  albuterol (PROVENTIL) (2.5 MG/3ML) 0.083% nebulizer solution 5 mg (not administered)  ipratropium (ATROVENT) nebulizer solution 0.5 mg (not administered)  magnesium sulfate IVPB 2 g 50 mL (not administered)  methylPREDNISolone sodium succinate (SOLU-MEDROL) 125 mg/2 mL injection 125 mg (not administered)     Initial Impression / Assessment and Plan / ED Course  I have reviewed the triage vital signs and the nursing notes.  Pertinent labs & imaging results that were available during my care of the patient were reviewed by me and considered in my medical decision making (see chart for details).  Clinical Course as of Apr 13 1701  Mon Apr 12, 2017  1755 Discussed with patient her results.  Lung sounds evaluated, she is still coughing frequently, is not wheezing, however sounds very tight still.  Will put in for an hour-long neb and  reassess  [EH]  1900 Patient reassessed, lung sounds much more open, she is no longer coughing as much, does not feel hot any more, says she feels like she is breathing easier. Looks much more comfortable.   [EH]  2001 Patient reassessed, lungs sound more wheezy than did after hour long.  She is not coughing.   [EH]  2055 Reassessed patient, she is coughing again, feels warm, and having increased wheezing. Will order hour-long neb.  [EH]  2100 Patient got up, walked to the bathroom, reports that her hands are tingley and they have been on and off through the day.   [EH]  2132 Spoke with hospitalist who will admit patient.   [EH]    Clinical Course User Index [EH] Cristina Gong, PA-C  PATIENT WITH ASTHMA EXACERBATION REACTIVE AIRWAY. Treated here in the ED withSolu-Medrol, acne CM and albuterol with Atrovent. Patient CT scan and DVT rule out are pending. I have given sign out to PA [place] who will assume care of the patient. She has been treated for presumed UTI with a single dose of 5 from ice and and UA sent for culture. Patient is having pain in her left lower quadrant and suprapubic region. She will receive CT scan of the abdomen. She is stable through her visit here. She declined pain and nausea medications.  BEEN SIGNED OUT TO THE ONCOMING PA  Final Clinical Impressions(s) / ED Diagnoses   Final diagnoses:  None    New Prescriptions New Prescriptions   No medications on file     Arthor Captain, PA-C 04/13/17 1702    Tilden Fossa, MD 04/14/17 [phone]

## 2017-04-12 NOTE — Progress Notes (Signed)
VASCULAR LAB PRELIMINARY  PRELIMINARY  PRELIMINARY  PRELIMINARY  Right lower extremity venous duplex completed.    Preliminary report:  Technically difficult to visualize the calf sadn pedal veins due to size. Right:  No obvious evidence of DVT, superficial thrombosis, or Baker's cyst.  Sherry Singh, RVS 04/12/2017, 5:22 PM

## 2017-04-12 NOTE — ED Provider Notes (Signed)
At shift care assumed care of patient from PA [facility]. Patient is deaf, however is able to read lips. She has been experiencing intermittent abdominal pain with nausea, along with shortness of breath. Plan for follow-up on labs, CT abdomen, DVT study lung exam.    CRITICAL CARE Performed by: Lyndel Safe Total critical care time: 31 minutes Critical care time was exclusive of separately billable procedures and treating other patients. Critical care was necessary to treat or prevent imminent or life-threatening deterioration. Critical care was time spent personally by me on the following activities: development of treatment plan with patient and/or surrogate as well as nursing, discussions with consultants, evaluation of patient's response to treatment, examination of patient, obtaining history from patient or surrogate, ordering and performing treatments and interventions, ordering and review of laboratory studies, ordering and review of radiographic studies, pulse oximetry and re-evaluation of patient's condition.  Asthma exacerbation requiring continuous nebulizer treatment, magnesium, steroids and admission.    Clinical Course as of Apr 13 106  Mon Apr 12, 2017  1755 Discussed with patient her results.  Lung sounds evaluated, she is still coughing frequently, is not wheezing, however sounds very tight still.  Will put in for an hour-long neb and reassess  [EH]  1900 Patient reassessed, lung sounds much more open, she is no longer coughing as much, does not feel hot any more, says she feels like she is breathing easier. Looks much more comfortable.   [EH]  2001 Patient reassessed, lungs sound more wheezy than did after hour long.  She is not coughing.   [EH]  2055 Reassessed patient, she is coughing again, feels warm, and having increased wheezing. Will order hour-long neb.  [EH]  2100 Patient got up, walked to the bathroom, reports that her hands are tingley and they have been on and  off through the day.   [EH]  2132 Spoke with hospitalist who will admit patient.   [EH]    Clinical Course User Index [EH] Cristina Gong, PA-C   Verlan Friends CT abdomen and pelvis was obtained without any acute abnormalities. She reports that her pain has been improving with fluid bolus.  Lower extremity venous duplex without DVT. Her lung sounds were initially reassessed and she was tight, and coughing. Before I assumed her care she had been given DuoNeb, total of 5 mg of albuterol, 125 mg Solu-Medrol,  And 2 mg magnesium.  I ordered an hour long albuterol neb treatment which improved her lung exam to very mild wheezing.  When I reassessed her lung sounds about 1 hour after hour long neb was completed she sounded tighter with bilateral wheezing.  I feel that she needs to be admitted for an asthma exacerbation.  I did consider other causes for her SOB such as PE, she was having a less severe asthma exacerbation, leg swellng, on 02/14/17 when they did a CTA chest that was negative for PE.  As she received a CT abdomen pelvis with contrast earlier today I do not feel like a CTA chest is appropriate right now as clinically I have a cause for her SOB, being wheezing, acute asthma exacerbation.  I spoke with the hospitalist who agreed to come and see the patient for admission.  I discussed the patient with Dr. Jacqulyn Bath who agreed with my  Plan to admit.        Cristina Gong, PA-C 04/13/17 1611    Maia Plan, MD 04/14/17 1319

## 2017-04-12 NOTE — ED Notes (Signed)
Denies abdominal pain at this time, but reports intermittent abdominal pain with nausea. Denies vomiting.

## 2017-04-12 NOTE — H&P (Signed)
History and Physical    Kristopher Delk ZOX:[id number] DOB: 23-May-1991 DOA: 04/12/2017  Referring MD/NP/PA: Lyndel Safe, PA-C PCP: Patient, No Pcp Per  Patient coming from: Home  Chief Complaint: Shortness of breath, Leg pain, and abdominal pain  HPI: Sherry Singh is a 26 y.o. female with medical history significant of asthma and being deaf; who presents with multiple complaints. The sign language interpreter services were utilized to obtain history. She reports moving from [place] by car with her husband and family back in August and they have been living with her in-laws. There has been question of possibility of there being mold in the home, but the patient does not know if this has been addressed. She reports productive cough with green sputum production that was progressively worsened over last 1 week. This is her third ER visit for similar symptoms in the last 2 months. The last ER visit was 3 weeks ago and she was sent home with cough medicine and steroids. Review of records shows that the initial visit on 8/12 a CT angiogram was performed that showed no signs of a pulmonary embolus. Patient reports that she completed steroids and shortly thereafter symptoms returned. She complains of wheezing and using albuterol inhaler without relief of symptoms. Denies having any significant fever, recent sick contacts. Other complaints include right leg pain over the last 1 week. She reports that her right leg is tender to the touch and any weightbearing. Denies having any recent trauma or significant leg swelling. Other associated symptoms include intermittent throbbing abdominal pain for which was initially midline, but now on the left side. Reports that these symptoms have been going on for month for which it is cost would have lack of appetite. Also notes that she is nauseous, but does not vomit.  ED Course: Upon admission into the emergency department patient was noted to be afebrile, pulse  94-144, respirations 12-24, blood pressure 92/64-125/72, O2 saturations maintained on room air. Labs revealed WBC 10.5, potassium 3.1, and all other labs including lipase within normal limits. Chest x-ray showed hyperinflation without signs of acute infiltrate. Urinalysis was positive for possible signs of infection. Vascular duplex Doppler ultrasound of the right lower extremity was reported to not show signs of a DVT, but official report was pending. CT scan of the abdomen showed no acute abnormalities although study was of poor quality due to motion artifact.  Patient was treated with 2 g of magnesium sulfate, 125 mg of Solu-Medrol, DuoNeb, and 2 hour long albuterol nebulized treatments. Following hour long nebulized treatment patient was noted to have elevation in heart rates. In addition to medications listed above she received 1 L of normal saline fluid and fosfomycin. [facility] called to admit.  Review of Systems  Constitutional: Negative for diaphoresis and fever.  HENT: Negative for ear discharge and nosebleeds.   Eyes: Negative for photophobia and pain.  Respiratory: Positive for cough, sputum production, shortness of breath and wheezing.   Cardiovascular: Negative for chest pain and leg swelling.  Gastrointestinal: Positive for abdominal pain and nausea. Negative for vomiting.  Genitourinary: Negative for dysuria and frequency.  Musculoskeletal: Positive for myalgias. Negative for falls.  Skin: Negative for itching and rash.  Neurological: Negative for speech change and focal weakness.  Endo/Heme/Allergies: Negative for polydipsia. Does not bruise/bleed easily.  Psychiatric/Behavioral: Negative for substance abuse and suicidal ideas.    Past Medical History:  Diagnosis Date  . Asthma   . Deaf     History reviewed. No pertinent surgical history.  reports that she has never smoked. She has never used smokeless tobacco. She reports that she does not drink alcohol. Her drug history is not  on file.  Allergies  Allergen Reactions  . Amoxicillin Rash    History reviewed. No pertinent family history of bleeding/clotting disorder.  Prior to Admission medications   Medication Sig Start Date End Date Taking? Authorizing Provider  albuterol (PROVENTIL HFA;VENTOLIN HFA) 108 (90 Base) MCG/ACT inhaler Inhale 1-2 puffs into the lungs every 6 (six) hours as needed for wheezing or shortness of breath. 02/14/17  Yes Everlene Farrier, PA-C  benzonatate (TESSALON) 100 MG capsule Take 1 capsule (100 mg total) by mouth every 8 (eight) hours. Patient taking differently: Take 100 mg by mouth daily as needed.  03/22/17  Yes Khatri, Hillary Bow, PA-C    Physical Exam:  Constitutional: Young female who appears to be in sick in moderate distress  Vitals:   04/12/17 2000 04/12/17 2030 04/12/17 2101 04/12/17 2102  BP: 122/76 92/64  (!) 109/59  Pulse: (!) 114 (!) 141 (!) 127 (!) 126  Resp:   16 (!) 24  Temp:      TempSrc:      SpO2: 98% 97% 98% 97%   Eyes: PERRL, lids and conjunctivae normal ENMT: Mucous membranes are dry. Posterior pharynx clear of any exudate or lesions. Normal dentition.  Neck: normal, supple, no masses, no thyromegaly Respiratory: Expiratory wheezes appreciated with patient intermittently coughing. No significant accessory muscle use is noted. Cardiovascular: Tachycardic, no murmurs / rubs / gallops. No extremity edema. 2+ pedal pulses. No carotid bruits.  Abdomen: no tenderness, no masses palpated. No hepatosplenomegaly. Bowel sounds positive.  Musculoskeletal: no clubbing / cyanosis. No joint deformity upper and lower extremities. Good ROM, no contractures. Normal muscle tone.  Skin: no rashes, lesions, ulcers. No induration Neurologic: CN 2-12 grossly intact. Sensation intact, DTR normal. Strength 5/5 in all 4.  Psychiatric: Normal judgment and insight. Alert and oriented x 3. Normal mood.     Labs on Admission: I have personally reviewed following labs and imaging  studies  CBC:  Recent Labs Lab 04/12/17 1046  WBC 10.5  HGB 14.9  HCT 41.8  MCV 83.8  PLT 290   Basic Metabolic Panel:  Recent Labs Lab 04/12/17 1046  NA 136  K 3.1*  CL 102  CO2 24  GLUCOSE 108*  BUN 6  CREATININE 0.58  CALCIUM 9.1   GFR: CrCl cannot be calculated (Unknown ideal weight.). Liver Function Tests:  Recent Labs Lab 04/12/17 1046  AST 39  ALT 48  ALKPHOS 68  BILITOT 0.9  PROT 7.2  ALBUMIN 4.0    Recent Labs Lab 04/12/17 1046  LIPASE 21   No results for input(s): AMMONIA in the last 168 hours. Coagulation Profile: No results for input(s): INR, PROTIME in the last 168 hours. Cardiac Enzymes: No results for input(s): CKTOTAL, CKMB, CKMBINDEX, TROPONINI in the last 168 hours. BNP (last 3 results) No results for input(s): PROBNP in the last 8760 hours. HbA1C: No results for input(s): HGBA1C in the last 72 hours. CBG: No results for input(s): GLUCAP in the last 168 hours. Lipid Profile: No results for input(s): CHOL, HDL, LDLCALC, TRIG, CHOLHDL, LDLDIRECT in the last 72 hours. Thyroid Function Tests: No results for input(s): TSH, T4TOTAL, FREET4, T3FREE, THYROIDAB in the last 72 hours. Anemia Panel: No results for input(s): VITAMINB12, FOLATE, FERRITIN, TIBC, IRON, RETICCTPCT in the last 72 hours. Urine analysis:    Component Value Date/Time   COLORURINE AMBER (A)  04/12/2017 1225   APPEARANCEUR HAZY (A) 04/12/2017 1225   LABSPEC 1.019 04/12/2017 1225   PHURINE 6.0 04/12/2017 1225   GLUCOSEU NEGATIVE 04/12/2017 1225   HGBUR LARGE (A) 04/12/2017 1225   BILIRUBINUR NEGATIVE 04/12/2017 1225   KETONESUR 20 (A) 04/12/2017 1225   PROTEINUR 30 (A) 04/12/2017 1225   NITRITE NEGATIVE 04/12/2017 1225   LEUKOCYTESUR SMALL (A) 04/12/2017 1225   Sepsis Labs: No results found for this or any previous visit (from the past 240 hour(s)).   Radiological Exams on Admission: Dg Chest 2 View  Result Date: 04/12/2017 CLINICAL DATA:  Wheezing, cough,  and shortness of breath. History of asthma. Nonsmoker. EXAM: CHEST  2 VIEW COMPARISON:  Chest x-ray of March 22, 2017. FINDINGS: The lungs are mildly hyperinflated. There is no focal infiltrate. There is no pleural effusion. The heart and pulmonary vascularity are normal. The mediastinum is normal in width. The bony thorax exhibits no acute abnormality. IMPRESSION: Hyperinflation consistent with known reactive airway disease. There is no acute cardiopulmonary abnormality. Electronically Signed   By: David  [place] M.D.   On: 04/12/2017 14:36   Ct Abdomen Pelvis W Contrast  Result Date: 04/12/2017 CLINICAL DATA:  26 year old with abdominal pain. Evaluate for diverticulitis. EXAM: CT ABDOMEN AND PELVIS WITH CONTRAST TECHNIQUE: Multidetector CT imaging of the abdomen and pelvis was performed using the standard protocol following bolus administration of intravenous contrast. CONTRAST:  ISOVUE-300 IOPAMIDOL (ISOVUE-300) INJECTION 61% COMPARISON:  None. FINDINGS: Lower chest: Extensive motion artifact at the lung bases. No large pleural effusions. Hepatobiliary: Extensive motion artifact limits evaluation but no gross abnormality. Portal venous system appears to be patent. Pancreas: Limited evaluation due to motion artifact without gross abnormality. Spleen: Very limited due to extensive motion artifact. Adrenals/Urinary Tract: Adrenal glands are not confidently identified due to the motion artifact. Limited evaluation of the kidneys due to motion artifact without hydronephrosis. Urinary bladder is unremarkable. Stomach/Bowel: Limited evaluation due to extensive motion artifact. No evidence for bowel dilatation or obstruction. Vascular/Lymphatic: No significant vascular findings are present. No enlarged abdominal or pelvic lymph nodes. Reproductive: Limited evaluation of the uterus and adnexal structures. Uterus appears to be retroverted. Other: No significant free fluid. Musculoskeletal: Focal sclerosis  along the left ischial tuberosity region is compatible with a bone island. Both hips are located. No acute bone abnormality. IMPRESSION: Technically challenging examination due to excessive motion artifact throughout the abdomen and pelvis. The study quality is nondiagnostic. No gross abnormalities as described. Electronically Signed   By: Richarda Overlie M.D.   On: 04/12/2017 16:54    EKG: Independently reviewed. Sinus tachycardia in 153 bpm  Assessment/Plan Acute asthma exacerbation: Acute. Patient was expiratory wheezes or respiratory distress noted on physical exam. Possible cause recurrent exacerbations include possibility of mold as noted by the patient in the home which they live. Chest x-ray otherwise clear showing no signs of acute infiltrate. - Admit to a telemetry bed - Continuous pulse oximetry with oxygen as needed - Prednisone 60 mg daily - DuoNeb's  Hypokalemia: Acute. Initial potassium 3.1 on admission. - Give 40 mEq of potassium chloride po  Right leg pain: Acute. Symptoms present over the last 1 week. Doppler ultrasound reported as negative for any signs of clot. Symptoms could be secondary to cramps from low potassium. - Follow-up official read  Abdominal pain: Patient reports intermittent abdominal pain over the last 1 month. Urinalysis was noted to be positive for signs of infection. CT scan of the abdomen although poor quality did not show  any acute signs of infection. - Check abdominal ultrasound.  Urinary tract infection: UA positive for signs of infection. Patient was given fosfomycin 3 g in the ED. - Follow-up urine culture   Sinus tachycardia: Acute. Heart rates elevated acutely into the 150s following multiple albuterol treatments. - Continue monitoring   DVT prophylaxis: lovenox  Code Status: full  Family Communication: No family present at bedside Disposition Plan: Discharge home once medically stable Consults called: none Admission status:  Observation  Clydie Braun MD [facility] Pager [phone]   If 7PM-7AM, please contact night-coverage [email or web address] Password [email or web address]  04/12/2017, 9:30 PM

## 2017-04-13 ENCOUNTER — Observation Stay (HOSPITAL_COMMUNITY): Payer: Medicaid - Out of State

## 2017-04-13 DIAGNOSIS — M79604 Pain in right leg: Secondary | ICD-10-CM | POA: Diagnosis present

## 2017-04-13 DIAGNOSIS — N390 Urinary tract infection, site not specified: Secondary | ICD-10-CM | POA: Diagnosis present

## 2017-04-13 DIAGNOSIS — J45901 Unspecified asthma with (acute) exacerbation: Secondary | ICD-10-CM

## 2017-04-13 DIAGNOSIS — E876 Hypokalemia: Secondary | ICD-10-CM | POA: Diagnosis present

## 2017-04-13 DIAGNOSIS — J4541 Moderate persistent asthma with (acute) exacerbation: Secondary | ICD-10-CM

## 2017-04-13 DIAGNOSIS — R109 Unspecified abdominal pain: Secondary | ICD-10-CM | POA: Diagnosis present

## 2017-04-13 LAB — CBC
HEMATOCRIT: 33.9 % — AB (ref 36.0–46.0)
HEMOGLOBIN: 11.8 g/dL — AB (ref 12.0–15.0)
MCH: 29.1 pg (ref 26.0–34.0)
MCHC: 34.8 g/dL (ref 30.0–36.0)
MCV: 83.5 fL (ref 78.0–100.0)
Platelets: 273 10*3/uL (ref 150–400)
RBC: 4.06 MIL/uL (ref 3.87–5.11)
RDW: 12.3 % (ref 11.5–15.5)
WBC: 9.1 10*3/uL (ref 4.0–10.5)

## 2017-04-13 LAB — BASIC METABOLIC PANEL
Anion gap: 10 (ref 5–15)
BUN: 5 mg/dL — AB (ref 6–20)
CHLORIDE: 109 mmol/L (ref 101–111)
CO2: 19 mmol/L — AB (ref 22–32)
CREATININE: 0.55 mg/dL (ref 0.44–1.00)
Calcium: 8.4 mg/dL — ABNORMAL LOW (ref 8.9–10.3)
GFR calc Af Amer: >60 mL/min (ref >60)
GFR calc non Af Amer: >60 mL/min (ref >60)
GLUCOSE: 128 mg/dL — AB (ref 65–99)
POTASSIUM: 3.3 mmol/L — AB (ref 3.5–5.1)
SODIUM: 138 mmol/L (ref 135–145)

## 2017-04-13 LAB — HIV ANTIBODY (ROUTINE TESTING W REFLEX): HIV Screen 4th Generation wRfx: NONREACTIVE

## 2017-04-13 MED ORDER — ACETAMINOPHEN 325 MG PO TABS: 650.0000 mg | ORAL_TABLET | Freq: Four times a day (QID) | ORAL | Status: AC | PRN

## 2017-04-13 MED ORDER — IPRATROPIUM-ALBUTEROL 0.5-2.5 (3) MG/3ML IN SOLN: 3.0000 mL | Freq: Four times a day (QID) | RESPIRATORY_TRACT | Status: DC

## 2017-04-13 MED ORDER — PREDNISONE 20 MG PO TABS: 20.0000 mg | ORAL_TABLET | Freq: Every day | ORAL | 0 refills | Status: AC

## 2017-04-13 MED ORDER — PANTOPRAZOLE SODIUM 40 MG PO TBEC
40.0000 mg | DELAYED_RELEASE_TABLET | Freq: Every day | ORAL | Status: DC
  Administered 2017-04-13: 40 mg via ORAL
  Filled 2017-04-13: qty 1

## 2017-04-13 MED ORDER — LEVOFLOXACIN 500 MG PO TABS: 500.0000 mg | ORAL_TABLET | Freq: Every day | ORAL | 0 refills | Status: AC

## 2017-04-13 NOTE — Progress Notes (Signed)
Sherry Singh, Sherry Singh 26 yrs old patient received from ED. Patient is alert and oriented.vital signs are stable . Skin assessment done with another nurse found intact. Patient given instruction about call bell and phone.bed is on low position and side rail up x2.

## 2017-04-13 NOTE — Progress Notes (Signed)
Verlan Friends to be D/C'd home per MD order. Discussed with the patient and all questions fully answered.  Allergies as of 04/13/2017      Reactions   Amoxicillin Rash      Medication List    TAKE these medications   acetaminophen 325 MG tablet Commonly known as:  TYLENOL Take 2 tablets (650 mg total) by mouth every 6 (six) hours as needed for mild pain (or Fever >/= 101).   albuterol 108 (90 Base) MCG/ACT inhaler Commonly known as:  PROVENTIL HFA;VENTOLIN HFA Inhale 1-2 puffs into the lungs every 6 (six) hours as needed for wheezing or shortness of breath.   benzonatate 100 MG capsule Commonly known as:  TESSALON Take 1 capsule (100 mg total) by mouth every 8 (eight) hours. What changed:  when to take this  reasons to take this   levofloxacin 500 MG tablet Commonly known as:  LEVAQUIN Take 1 tablet (500 mg total) by mouth daily. For 3days   predniSONE 20 MG tablet Commonly known as:  DELTASONE Take 1-2 tablets (20-40 mg total) by mouth daily with breakfast. Take  for 2days then  for 3days then STOP       VVS, Skin clean, dry and intact without evidence of skin break down, no evidence of skin tears noted.  IV catheter discontinued intact. Site without signs and symptoms of complications. Dressing and pressure applied.  An After Visit Summary was printed and given to the patient.  Patient escorted via WC, and D/C home via private auto.  Jon Gills  04/13/2017 2:57 PM Verlan Friends to be D/C'd home per MD order. Discussed with the patient and all questions fully answered.  Allergies as of 04/13/2017      Reactions   Amoxicillin Rash      Medication List    TAKE these medications   acetaminophen 325 MG tablet Commonly known as:  TYLENOL Take 2 tablets (650 mg total) by mouth every 6 (six) hours as needed for mild pain (or Fever >/= 101).   albuterol 108 (90 Base) MCG/ACT inhaler Commonly known as:  PROVENTIL HFA;VENTOLIN HFA Inhale 1-2 puffs into  the lungs every 6 (six) hours as needed for wheezing or shortness of breath.   benzonatate 100 MG capsule Commonly known as:  TESSALON Take 1 capsule (100 mg total) by mouth every 8 (eight) hours. What changed:  when to take this  reasons to take this   levofloxacin 500 MG tablet Commonly known as:  LEVAQUIN Take 1 tablet (500 mg total) by mouth daily. For 3days   predniSONE 20 MG tablet Commonly known as:  DELTASONE Take 1-2 tablets (20-40 mg total) by mouth daily with breakfast. Take  for 2days then  for 3days then STOP       VVS, Skin clean, dry and intact without evidence of skin break down, no evidence of skin tears noted.  IV catheter discontinued intact. Site without signs and symptoms of complications. Dressing and pressure applied.  An After Visit Summary was printed and given to the patient.  Patient ambulated out with boyfriend and daughter, and D/C home via private auto.  Jon Gills  04/13/2017 2:57 PM H

## 2017-04-13 NOTE — Discharge Summary (Signed)
Physician Discharge Summary  Sherry Singh ZOX:[id number] DOB: [birth year] DOA: 04/12/2017  PCP: Patient, No Pcp Per  Admit date: 04/12/2017 Discharge date: 04/13/2017  Time spent: 35 minutes  Recommendations for Outpatient Follow-up:  1. PCP in 1 week, please FU on URine cultures   Discharge Diagnoses:  Principal Problem:   Asthma exacerbation Active Problems:   Abdominal pain   Possible UTI   Hypokalemia   Right leg pain   Deafness  Discharge Condition: stable  Diet recommendation: regular  There were no vitals filed for this visit.  History of present illness:  Sherry Singh is a 26 y.o. female with medical history significant of asthma and being deaf; who presents with multiple complaints. The sign language interpreter services were utilized to obtain history. She reports moving from [place] by car with her husband and family back in August and they have been living with her in-laws. There has been question of possibility of there being mold in the home, but the patient does not know if this has been addressed. She reports productive cough with green sputum production that was progressively worsened over last 1 week. This is her third ER visit for similar symptoms in the last 2 months. The last ER visit was 3 weeks ago and she was sent home with cough medicine and steroids. Review of records shows that the initial visit on 8/12 a CT angiogram was performed that showed no signs of a pulmonary embolus  Hospital Course:  Acute asthma exacerbation: -noted to have Exp wheezes on admission -improved with IV steroids, nebs -CXR unremarkable -discharged home on prednisone taper, has albuterol PRN -advised FU with Pulm in 1 month due to recurrent exacerbations  Hypokalemia: Acute. Initial potassium 3.1 on admission. - replaced  Right leg pain: Acute.  -Symptoms present over the last 1 week. Doppler ultrasound reported as negative for any signs of clot.  - could have been  myalgias from Viral syndrome -improved  Abdominal pain:  -Patient reported intermittent abdominal pain over the last 1 month. Urinalysis was noted to be positive for signs of infection. CT scan of the abdomen although poor quality did not show any acute signs of infection. - Abdominal ultrasound, unremarkable except for Gb sludge, no stones in GB, no cholelithiases -stable now, tolerating diet at discharge  UTI -some symptoms suspicious for UTI -treated with fosphomycin in the ED, then with ceftriaxone, improved, transitioned to Po levaquin at [facility] which will provide pulm and urinary coverage -Urine Cx pending at discharge   Discharge Exam: Vitals:   04/13/17 0853 04/13/17 1145  BP:    Pulse: 82 81  Resp: 18 18  Temp:    SpO2: 100% 100%    General: AAOx3 Cardiovascular:S1S2/RRR Respiratory: CTAB Discharge Instructions   Discharge Instructions    Diet general    Complete by:  As directed    Increase activity slowly    Complete by:  As directed      Current Discharge Medication List    START taking these medications   Details  acetaminophen (TYLENOL) 325 MG tablet Take 2 tablets (650 mg total) by mouth every 6 (six) hours as needed for mild pain (or Fever >/= 101).    levofloxacin (LEVAQUIN) 500 MG tablet Take 1 tablet (500 mg total) by mouth daily. For 3days Qty: 3 tablet, Refills: 0    predniSONE (DELTASONE) 20 MG tablet Take 1-2 tablets (20-40 mg total) by mouth daily with breakfast. Take  for 2days then  for 3days then STOP Qty:  8 tablet, Refills: 0      CONTINUE these medications which have NOT CHANGED   Details  albuterol (PROVENTIL HFA;VENTOLIN HFA) 108 (90 Base) MCG/ACT inhaler Inhale 1-2 puffs into the lungs every 6 (six) hours as needed for wheezing or shortness of breath. Qty: 1 Inhaler, Refills: 0    benzonatate (TESSALON) 100 MG capsule Take 1 capsule (100 mg total) by mouth every 8 (eight) hours. Qty: 21 capsule, Refills: 0        Allergies  Allergen Reactions  . Amoxicillin Rash      The results of significant diagnostics from this hospitalization (including imaging, microbiology, ancillary and laboratory) are listed below for reference.    Significant Diagnostic Studies: Dg Chest 2 View  Result Date: 04/12/2017 CLINICAL DATA:  Wheezing, cough, and shortness of breath. History of asthma. Nonsmoker. EXAM: CHEST  2 VIEW COMPARISON:  Chest x-ray of March 22, 2017. FINDINGS: The lungs are mildly hyperinflated. There is no focal infiltrate. There is no pleural effusion. The heart and pulmonary vascularity are normal. The mediastinum is normal in width. The bony thorax exhibits no acute abnormality. IMPRESSION: Hyperinflation consistent with known reactive airway disease. There is no acute cardiopulmonary abnormality. Electronically Signed   By: David  [place] M.D.   On: 04/12/2017 14:36   Dg Chest 2 View  Result Date: 03/22/2017 CLINICAL DATA:  Midline chest pain EXAM: CHEST  2 VIEW COMPARISON:  02/14/2017 FINDINGS: Heart and mediastinal contours are within normal limits. No focal opacities or effusions. No acute bony abnormality. IMPRESSION: No active cardiopulmonary disease. Electronically Signed   By: Charlett Nose M.D.   On: 03/22/2017 09:59   US Abdomen Complete  Result Date: 04/13/2017 CLINICAL DATA:  Generalized abdominal pain EXAM: ABDOMEN ULTRASOUND COMPLETE COMPARISON:  CT 04/12/2017 FINDINGS: Gallbladder: Moderate to large amount of sludge within the gallbladder. No visible stones, wall thickening or sonographic Murphy sign. Common bile duct: Diameter: Normal caliber, 4 mm Liver: No focal lesion identified. Within normal limits in parenchymal echogenicity. Portal vein is patent on color Doppler imaging with normal direction of blood flow towards the liver. IVC: No abnormality visualized. Pancreas: Visualized portion unremarkable. Spleen: Size and appearance within normal limits. Right Kidney: Length: 11.0 cm.  Echogenicity within normal limits. No mass or hydronephrosis visualized. Left Kidney: Length: 11.2 cm. Echogenicity within normal limits. No mass or hydronephrosis visualized. Abdominal aorta: No aneurysm visualized. Other findings: None. IMPRESSION: Sludge within the gallbladder without visible stones or evidence of acute cholecystitis. Otherwise unremarkable abdominal ultrasound. Electronically Signed   By: Charlett Nose M.D.   On: 04/13/2017 08:10   Ct Abdomen Pelvis W Contrast  Result Date: 04/12/2017 CLINICAL DATA:  26 year old with abdominal pain. Evaluate for diverticulitis. EXAM: CT ABDOMEN AND PELVIS WITH CONTRAST TECHNIQUE: Multidetector CT imaging of the abdomen and pelvis was performed using the standard protocol following bolus administration of intravenous contrast. CONTRAST:  ISOVUE-300 IOPAMIDOL (ISOVUE-300) INJECTION 61% COMPARISON:  None. FINDINGS: Lower chest: Extensive motion artifact at the lung bases. No large pleural effusions. Hepatobiliary: Extensive motion artifact limits evaluation but no gross abnormality. Portal venous system appears to be patent. Pancreas: Limited evaluation due to motion artifact without gross abnormality. Spleen: Very limited due to extensive motion artifact. Adrenals/Urinary Tract: Adrenal glands are not confidently identified due to the motion artifact. Limited evaluation of the kidneys due to motion artifact without hydronephrosis. Urinary bladder is unremarkable. Stomach/Bowel: Limited evaluation due to extensive motion artifact. No evidence for bowel dilatation or obstruction. Vascular/Lymphatic: No significant vascular findings  are present. No enlarged abdominal or pelvic lymph nodes. Reproductive: Limited evaluation of the uterus and adnexal structures. Uterus appears to be retroverted. Other: No significant free fluid. Musculoskeletal: Focal sclerosis along the left ischial tuberosity region is compatible with a bone island. Both hips are located. No  acute bone abnormality. IMPRESSION: Technically challenging examination due to excessive motion artifact throughout the abdomen and pelvis. The study quality is nondiagnostic. No gross abnormalities as described. Electronically Signed   By: Richarda Overlie M.D.   On: 04/12/2017 16:54    Microbiology: No results found for this or any previous visit (from the past 240 hour(s)).   Labs: Basic Metabolic Panel:  Recent Labs Lab 04/12/17 1046 04/13/17 0353  NA 136 138  K 3.1* 3.3*  CL 102 109  CO2 24 19*  GLUCOSE 108* 128*  BUN 6 5*  CREATININE 0.58 0.55  CALCIUM 9.1 8.4*   Liver Function Tests:  Recent Labs Lab 04/12/17 1046  AST 39  ALT 48  ALKPHOS 68  BILITOT 0.9  PROT 7.2  ALBUMIN 4.0    Recent Labs Lab 04/12/17 1046  LIPASE 21   No results for input(s): AMMONIA in the last 168 hours. CBC:  Recent Labs Lab 04/12/17 1046 04/13/17 0353  WBC 10.5 9.1  HGB 14.9 11.8*  HCT 41.8 33.9*  MCV 83.8 83.5  PLT 290 273   Cardiac Enzymes: No results for input(s): CKTOTAL, CKMB, CKMBINDEX, TROPONINI in the last 168 hours. BNP: BNP (last 3 results) No results for input(s): BNP in the last 8760 hours.  ProBNP (last 3 results) No results for input(s): PROBNP in the last 8760 hours.  CBG: No results for input(s): GLUCAP in the last 168 hours.     SignedZannie Cove MD.  [facility] 04/13/2017, 1:15 PM

## 2017-04-14 LAB — URINE CULTURE

## 2018-04-24 IMAGING — CT CT ABD-PELV W/ CM
2 of 4 series · 16 of 46 positions shown, 18 images · IV contrast (APPLIED)
Comparison: None.

CLINICAL DATA: 26-year-old with abdominal pain. Evaluate for
diverticulitis.

EXAM:
CT ABDOMEN AND PELVIS WITH CONTRAST
TECHNIQUE: Multidetector CT imaging of the abdomen and pelvis was performed
using the standard protocol following bolus administration of
intravenous contrast.
CONTRAST:  100mL 1JVRRP-BGG IOPAMIDOL (1JVRRP-BGG) INJECTION 61%

[Series 3: abd/ pelvis 5.0 i30f 2 · axial · 0.78mm/px · z∈[+786,+1206]mm · 13 of 92 slices shown, 15 images]
[im 4/92  soft-tissue]
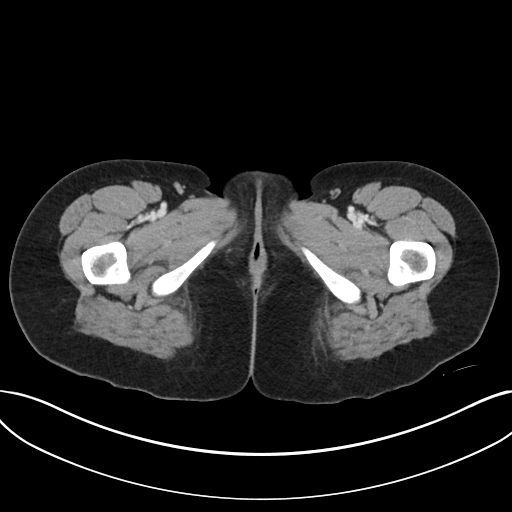
[im 4/92  bone]
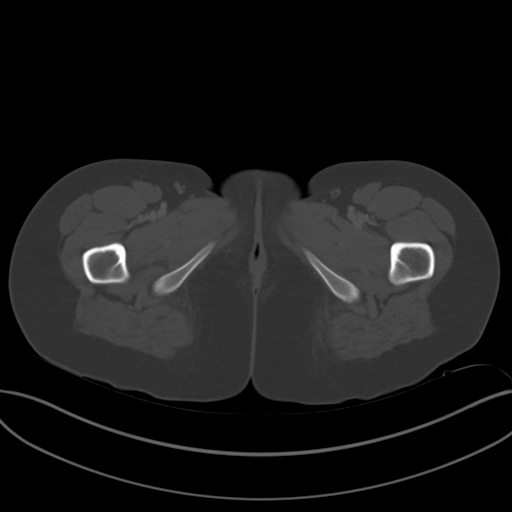
[im 12/92  soft-tissue]
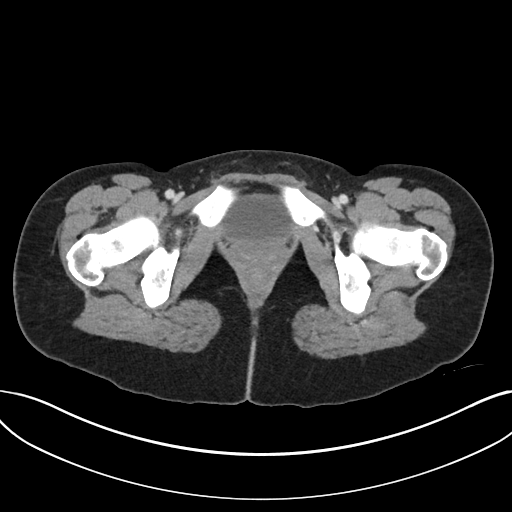
[im 20/92  soft-tissue]
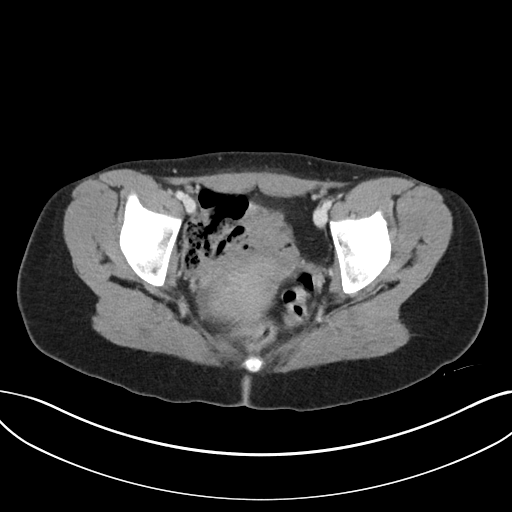
[im 24/92  soft-tissue]
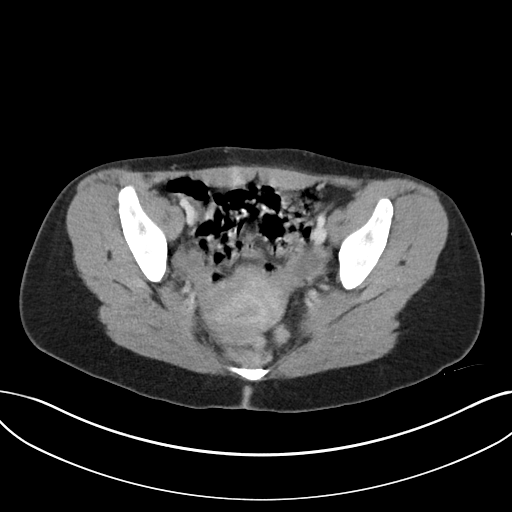
[im 32/92  soft-tissue]
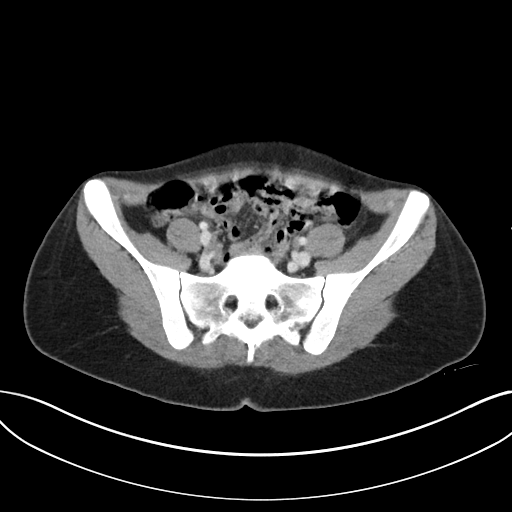
[im 40/92  soft-tissue]
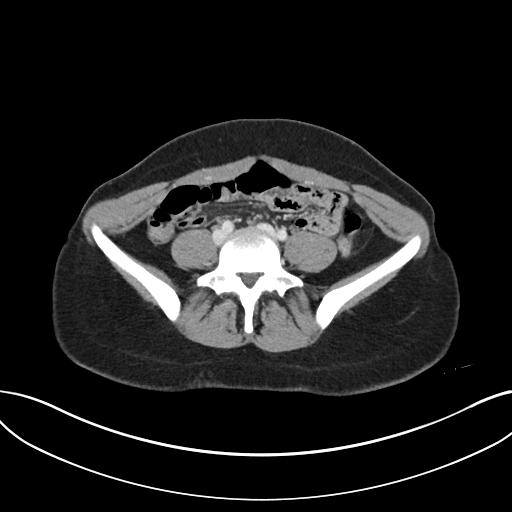
[im 48/92  soft-tissue]
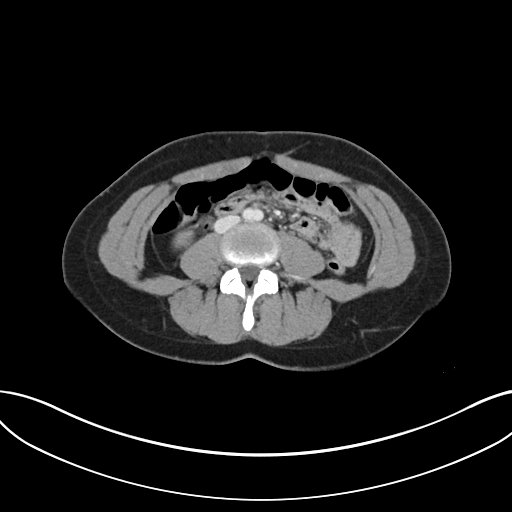
[im 52/92  soft-tissue]
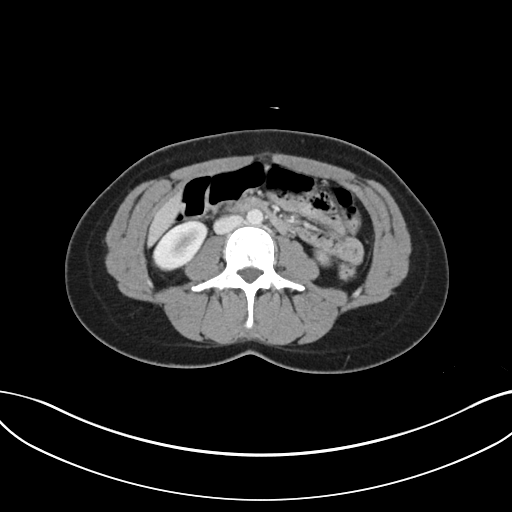
[im 60/92  soft-tissue]
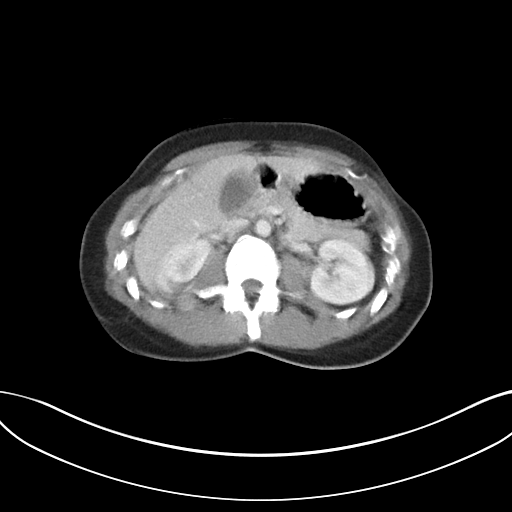
[im 60/92  bone]
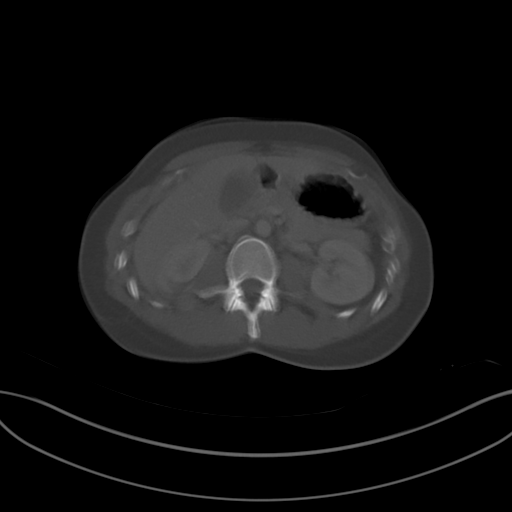
[im 68/92  soft-tissue]
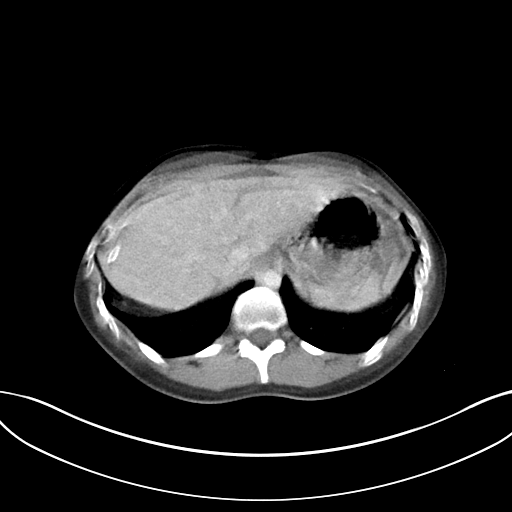
[im 72/92  soft-tissue]
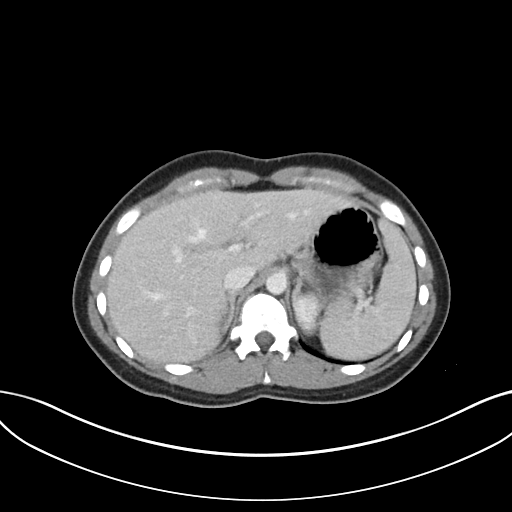
[im 80/92  soft-tissue]
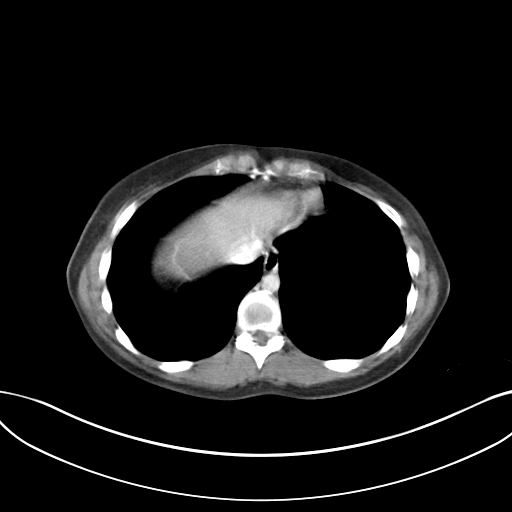
[im 88/92  soft-tissue]
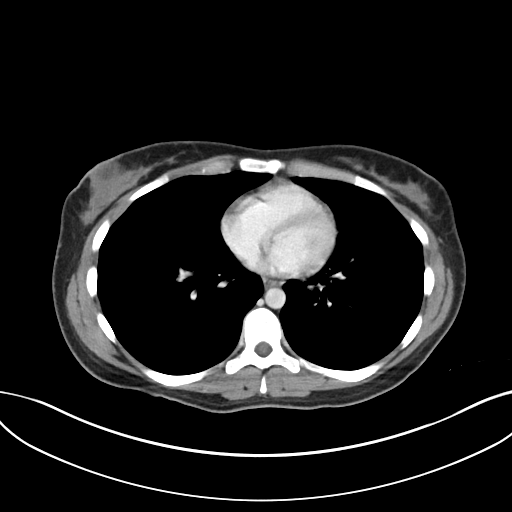

[Series 6: coronal soft tissue · coronal · 0.79mm/px · 3 of 82 slices shown]
[im 28/82  soft-tissue]
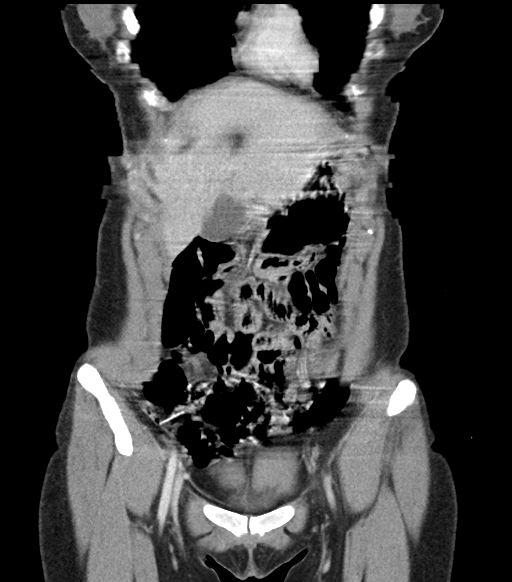
[im 37/82  soft-tissue]
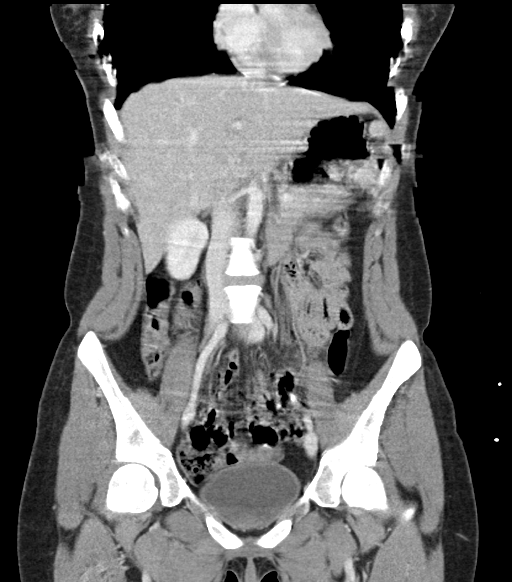
[im 46/82  soft-tissue]
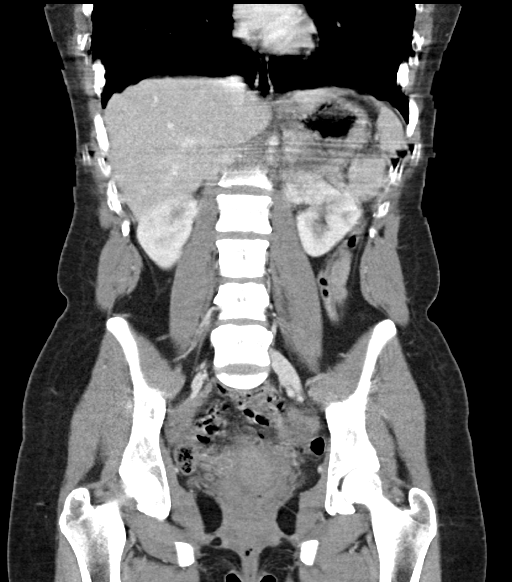

[16 of 46 positions shown; findings below may reference images not displayed]

FINDINGS: Lower chest: Extensive motion artifact at the lung bases. No large
pleural effusions.

Hepatobiliary: Extensive motion artifact limits evaluation but no
gross abnormality. Portal venous system appears to be patent.

Pancreas: Limited evaluation due to motion artifact without gross
abnormality.

Spleen: Very limited due to extensive motion artifact.

Adrenals/Urinary Tract: Adrenal glands are not confidently
identified due to the motion artifact. Limited evaluation of the
kidneys due to motion artifact without hydronephrosis. Urinary
bladder is unremarkable.

Stomach/Bowel: Limited evaluation due to extensive motion artifact.
No evidence for bowel dilatation or obstruction.

Vascular/Lymphatic: No significant vascular findings are present. No
enlarged abdominal or pelvic lymph nodes.

Reproductive: Limited evaluation of the uterus and adnexal
structures. Uterus appears to be retroverted.

Other: No significant free fluid.

Musculoskeletal: Focal sclerosis along the left ischial tuberosity
region is compatible with a bone island. Both hips are located. No
acute bone abnormality.
IMPRESSION: Technically challenging examination due to excessive motion artifact
throughout the abdomen and pelvis. The study quality is
nondiagnostic. No gross abnormalities as described.
